# Patient Record
Sex: Female | Born: 2000 | Race: White | Hispanic: No | Marital: Single | Smoking: Never smoker
Health system: Southern US, Community
[De-identification: ages and names within clinical notes are randomized; demographics above are authoritative.]

## PROBLEM LIST (undated history)

## (undated) DIAGNOSIS — T7840XA Allergy, unspecified, initial encounter: Secondary | ICD-10-CM

## (undated) DIAGNOSIS — L309 Dermatitis, unspecified: Secondary | ICD-10-CM

## (undated) DIAGNOSIS — L858 Other specified epidermal thickening: Secondary | ICD-10-CM

## (undated) DIAGNOSIS — J45909 Unspecified asthma, uncomplicated: Secondary | ICD-10-CM

## (undated) DIAGNOSIS — J45990 Exercise induced bronchospasm: Secondary | ICD-10-CM

## (undated) HISTORY — DX: Exercise induced bronchospasm: J45.990

## (undated) HISTORY — DX: Dermatitis, unspecified: L30.9

## (undated) HISTORY — DX: Other specified epidermal thickening: L85.8

## (undated) HISTORY — DX: Allergy, unspecified, initial encounter: T78.40XA

---
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
## 2020-04-25 IMAGING — CT CT ENTEROGRAPHY (ABD-PELV W/ CM)
2 of 5 series · 16 of 46 positions shown, 18 images · IV contrast (iopamidol)
Comparison: None.

CLINICAL DATA: Left lower quadrant abdominal pain, diarrhea,
abnormal colonoscopy with erosions (suspected Crohn's)

EXAM:
CT ABDOMEN AND PELVIS WITH CONTRAST (ENTEROGRAPHY)
TECHNIQUE: Multidetector CT of the abdomen and pelvis during bolus
administration of intravenous contrast. Negative oral contrast was
given.
CONTRAST:  100mL WJWR50-UBB IOPAMIDOL (WJWR50-UBB) INJECTION 61%

[Series 4: enterography 2.00 br40 s3 cor cor · coronal · 0.58mm/px · 3 of 107 slices shown]
[im 36/107  soft-tissue]
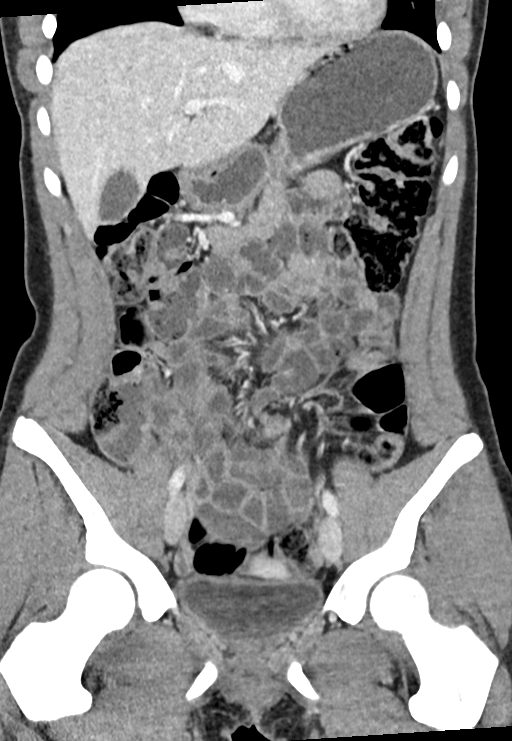
[im 48/107  soft-tissue]
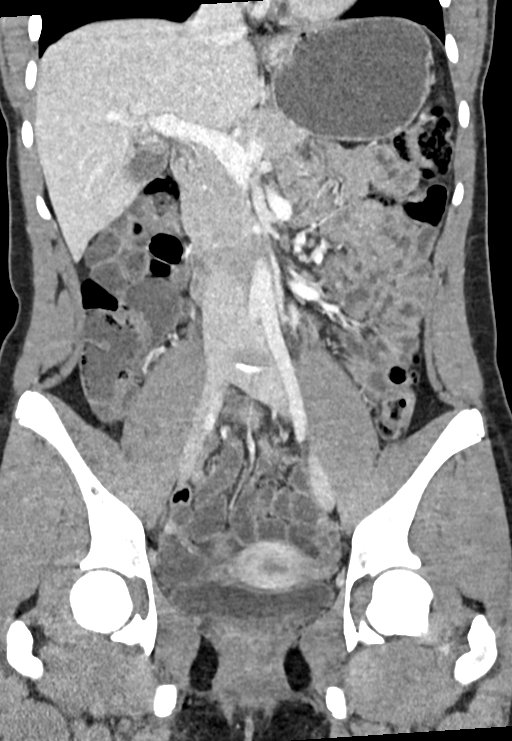
[im 59/107  soft-tissue]
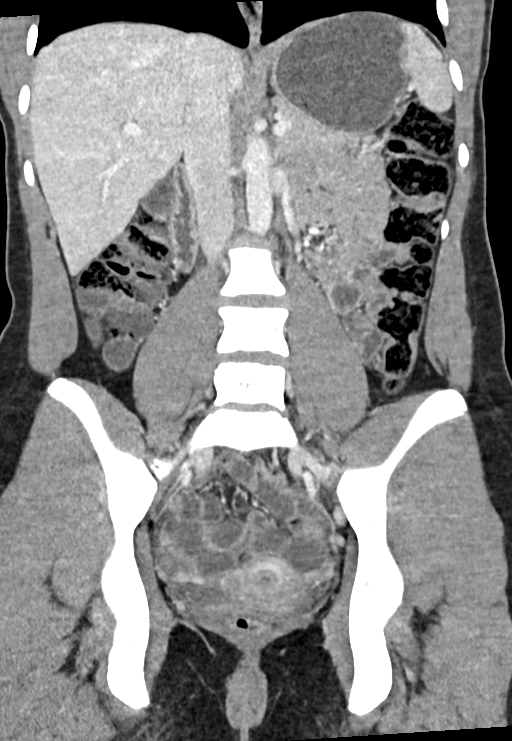

[Series 9: enterography 3.00 br40 s3 axial 3mm · axial · 0.61mm/px · z∈[+1335,+1710]mm · 13 of 143 slices shown, 15 images]
[im 9/143  soft-tissue]
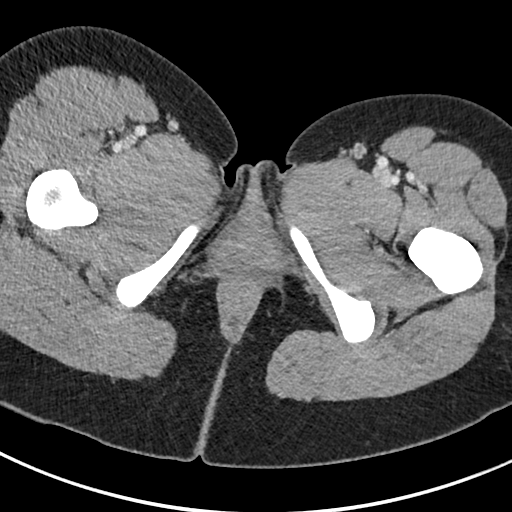
[im 9/143  bone]
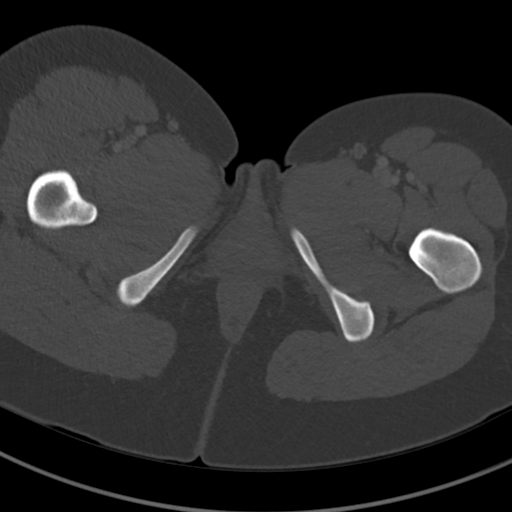
[im 17/143  soft-tissue]
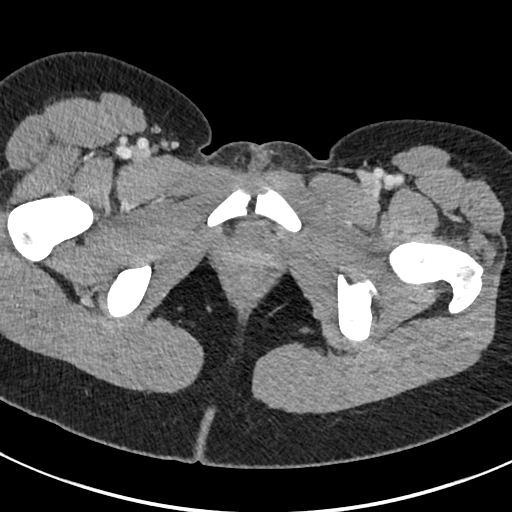
[im 34/143  soft-tissue]
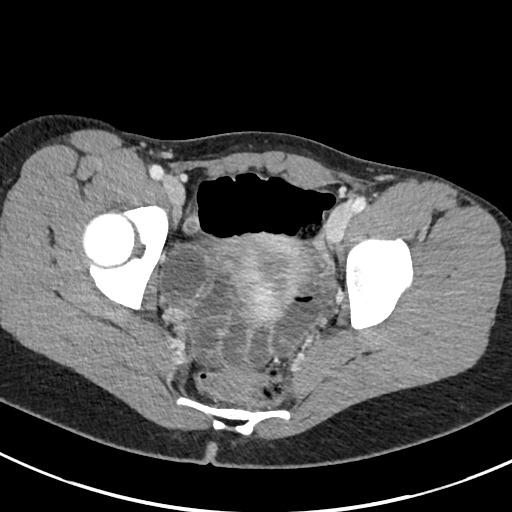
[im 42/143  soft-tissue]
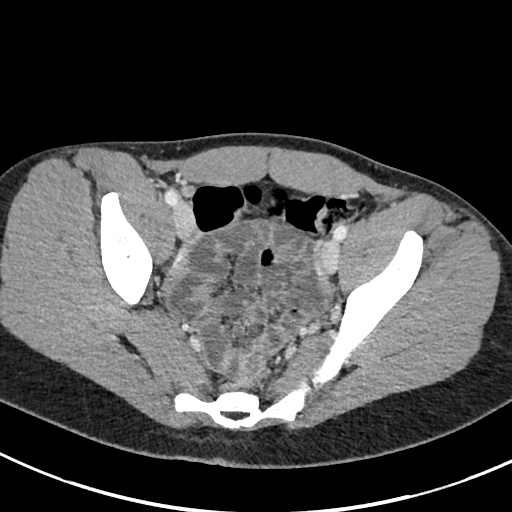
[im 51/143  soft-tissue]
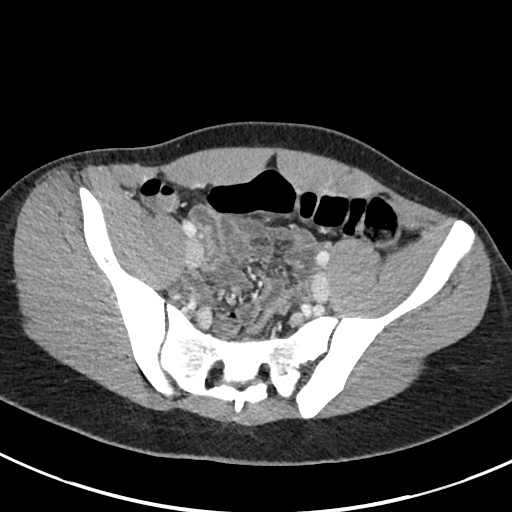
[im 59/143  soft-tissue]
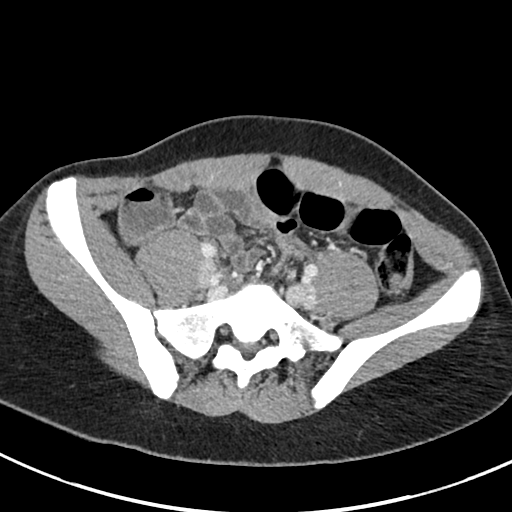
[im 76/143  soft-tissue]
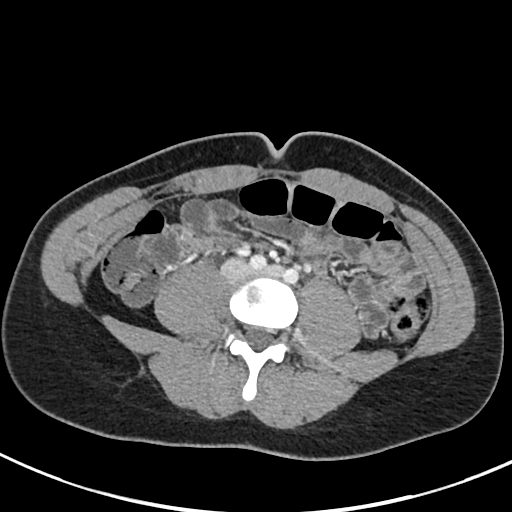
[im 84/143  soft-tissue]
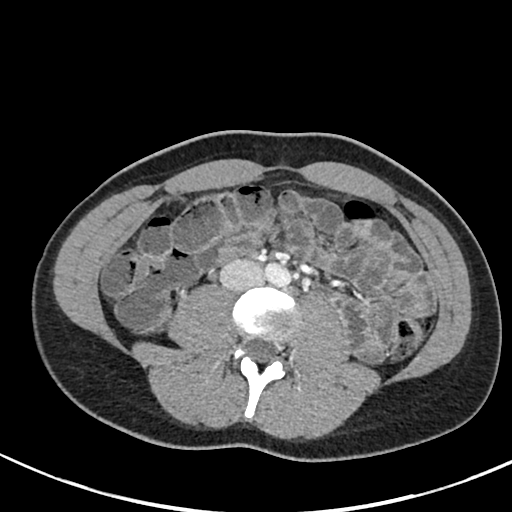
[im 92/143  soft-tissue]
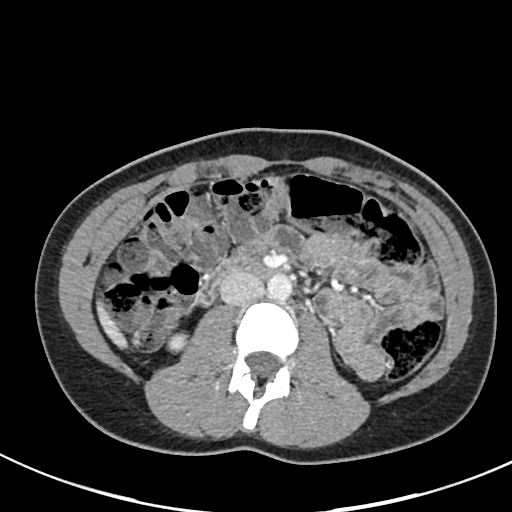
[im 92/143  bone]
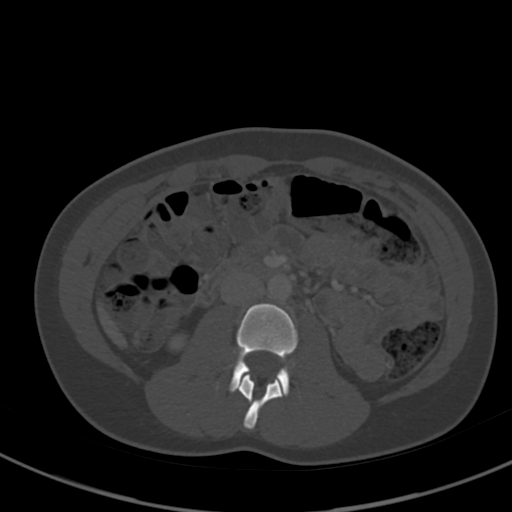
[im 101/143  soft-tissue]
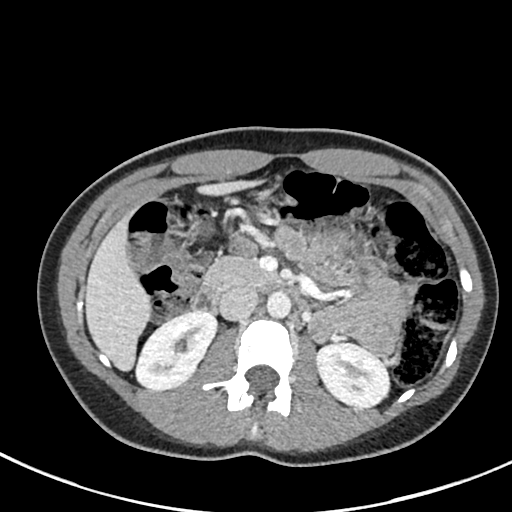
[im 109/143  soft-tissue]
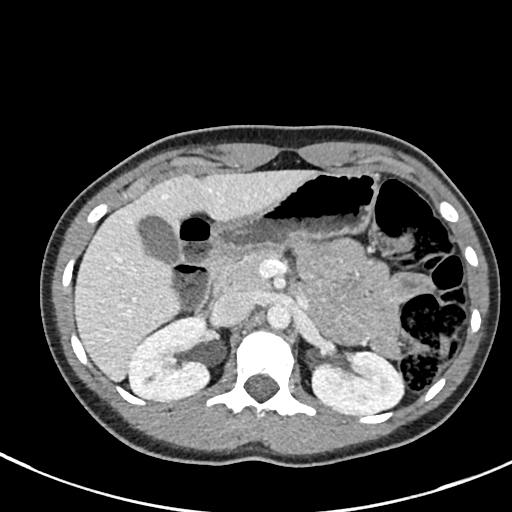
[im 126/143  soft-tissue]
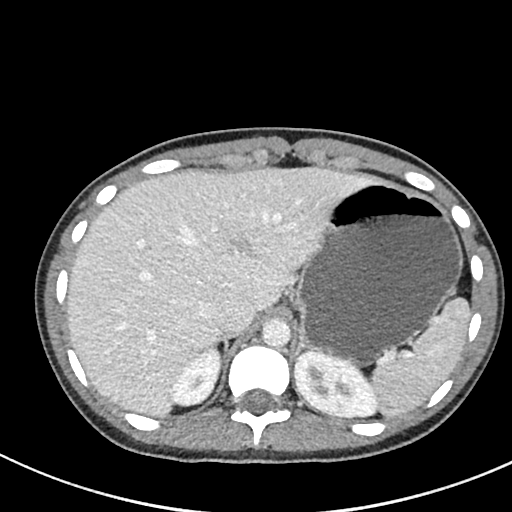
[im 134/143  soft-tissue]
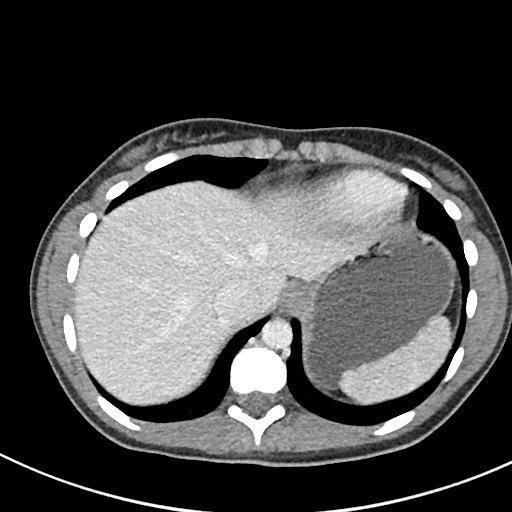

[16 of 46 positions shown; findings below may reference images not displayed]

FINDINGS: Lower chest:  Lung bases are clear.

Hepatobiliary: Liver is within normal limits.

Gallbladder is unremarkable. No intrahepatic or extrahepatic ductal
dilatation.

Pancreas: Within normal limits.

Spleen: Within normal limits.

Adrenals/Urinary Tract: Adrenal glands within normal limits.

Kidneys are within normal limits.  No hydronephrosis.

Bladder is within normal limits.

Stomach/Bowel: Stomach is within normal limits.

No evidence of bowel obstruction. No small bowel wall thickening or
inflammatory changes. Specifically, the terminal ileum is within
normal limits.

Normal appendix (series 9/image 98).

No colonic wall thickening or inflammatory changes.

Vascular/Lymphatic: No evidence of abdominal aortic aneurysm.

No suspicious abdominopelvic lymphadenopathy.

Reproductive: Uterus is within normal limits.

Left ovary is within normal limits.  No right adnexal mass.

Other: No abdominopelvic ascites.

Musculoskeletal: Visualized osseous structures are within normal
limits.
IMPRESSION: Negative CT enterography.

Specifically, no bowel wall thickening or inflammatory changes to
suggest active inflammatory Crohn's disease.

## 2024-02-01 ENCOUNTER — Other Ambulatory Visit: Payer: Self-pay

## 2024-02-01 ENCOUNTER — Emergency Department (HOSPITAL_BASED_OUTPATIENT_CLINIC_OR_DEPARTMENT_OTHER)
Admission: EM | Admit: 2024-02-01 | Discharge: 2024-02-01 | Disposition: A | Discharge status: Home / Self Care | Attending: Emergency Medicine | Admitting: Emergency Medicine

## 2024-02-01 ENCOUNTER — Emergency Department (HOSPITAL_BASED_OUTPATIENT_CLINIC_OR_DEPARTMENT_OTHER)

## 2024-02-01 ENCOUNTER — Encounter (HOSPITAL_BASED_OUTPATIENT_CLINIC_OR_DEPARTMENT_OTHER): Payer: Self-pay

## 2024-02-01 DIAGNOSIS — R1031 Right lower quadrant pain: Secondary | ICD-10-CM | POA: Diagnosis present

## 2024-02-01 LAB — CBC
HCT: 37.8 % (ref 36.0–46.0)
Hemoglobin: 12.5 g/dL (ref 12.0–15.0)
MCH: 29.5 pg (ref 26.0–34.0)
MCHC: 33.1 g/dL (ref 30.0–36.0)
MCV: 89.2 fL (ref 80.0–100.0)
Platelets: 250 K/uL (ref 150–400)
RBC: 4.24 MIL/uL (ref 3.87–5.11)
RDW: 12.5 % (ref 11.5–15.5)
WBC: 6.2 K/uL (ref 4.0–10.5)
nRBC: 0 % (ref 0.0–0.2)

## 2024-02-01 LAB — COMPREHENSIVE METABOLIC PANEL WITH GFR
ALT: 12 U/L (ref 0–44)
AST: 19 U/L (ref 15–41)
Albumin: 4.4 g/dL (ref 3.5–5.0)
Alkaline Phosphatase: 58 U/L (ref 38–126)
Anion gap: 10 (ref 5–15)
BUN: 13 mg/dL (ref 6–20)
CO2: 25 mmol/L (ref 22–32)
Calcium: 9.8 mg/dL (ref 8.9–10.3)
Chloride: 102 mmol/L (ref 98–111)
Creatinine, Ser: 0.73 mg/dL (ref 0.44–1.00)
GFR, Estimated: >60 mL/min
Glucose, Bld: 85 mg/dL (ref 70–99)
Potassium: 4.1 mmol/L (ref 3.5–5.1)
Sodium: 137 mmol/L (ref 135–145)
Total Bilirubin: 0.3 mg/dL (ref 0.0–1.2)
Total Protein: 7.2 g/dL (ref 6.5–8.1)

## 2024-02-01 LAB — URINALYSIS, ROUTINE W REFLEX MICROSCOPIC
Bilirubin Urine: NEGATIVE
Glucose, UA: NEGATIVE mg/dL
Hgb urine dipstick: NEGATIVE
Ketones, ur: NEGATIVE mg/dL
Nitrite: NEGATIVE
Protein, ur: NEGATIVE mg/dL
Specific Gravity, Urine: 1.016 (ref 1.005–1.030)
pH: 6.5 (ref 5.0–8.0)

## 2024-02-01 LAB — PREGNANCY, URINE: Preg Test, Ur: NEGATIVE

## 2024-02-01 LAB — LIPASE, BLOOD: Lipase: 26 U/L (ref 11–51)

## 2024-02-01 NOTE — ED Triage Notes (Signed)
 She c/o rlq area abd. Pain, waxing and waning x 1 week. She theorizes it may be another ovarian cyst--I've had those before. She is ambulatory and in no distress.

## 2024-02-01 NOTE — ED Notes (Signed)
 Patient transported to Ultrasound

## 2024-02-01 NOTE — ED Provider Notes (Signed)
 Patient seen in conjunction with [facility]. Pt with waxing/waning RLQ pain.  Workup here including labs are normal, ultrasound to evaluate for possibility of torsion or ovarian cyst was also negative.  Patient with no significant tenderness at time of exam, reports a dull ache.  No rebound or guarding or significant tenderness in the right lower quadrant.  Exam not consistent with appendicitis at this time.  BP (!) 123/90 (BP Location: Right Arm)   Pulse 71   Temp 98.1 F (36.7 C)   Resp 15   LMP 10/15/2023   SpO2 100%      Desiderio Chew, PA-C 02/01/24 1147    Ruthe Cornet, DO 02/01/24 1156

## 2024-02-01 NOTE — Discharge Instructions (Signed)
 Continue to rest and stay hydrated.  If pain continues follow-up with your primary care.  If the pain worsens or you start to develop any new symptoms return to the ER.

## 2024-02-01 NOTE — ED Provider Notes (Signed)
 " [place] EMERGENCY DEPARTMENT AT [facility] Provider Note   CSN: [id number] Arrival date & time: 02/01/24  9082     Patient presents with: Abdominal Pain   Marcia Schneider is a 23 y.o. female.    Abdominal Pain  23 year old female presenting with abdominal pain.  Patient reports that this has been going on for about a week.  Patient states that last Saturday she started to note a sharp cramping pain in her lower right abdomen.  This lasted for about 4 hours and subsided.  Since then she has had a dull ache in that area.  Yesterday after she began running she noticed that she had the same sharp cramping pain that she had had earlier in the week.  She has not taken anything for the pain.  The pain has not moved anywhere.  She did have a ovarian cyst that ruptured when she was 13 and states that the pain kind of feels like that.  Patient reports of constipation earlier in the week but that has since passed after taking MiraLAX.  She denies any urinary symptoms.  She denies any chest pain or shortness of breath.  Denies any fever or chills.  States that she has just felt off all week.  Patient denies any chance of being pregnant.  Denies any worries for STIs.  She states the pain is a 2 out of 10 pain.     Prior to Admission medications  Medication Sig Start Date End Date Taking? Authorizing Provider  albuterol  (PROVENTIL  HFA;VENTOLIN  HFA) 108 (90 Base) MCG/ACT inhaler Inhale 2 puffs into the lungs every 4 (four) hours as needed for wheezing. 08/02/15 01/31/17  Klett, Macario HERO, NP  beclomethasone (QVAR ) 40 MCG/ACT inhaler Inhale 1 puff into the lungs 2 (two) times daily. 08/02/15 02/01/16  Belenda Macario HERO, NP  ibuprofen  (ADVIL ,MOTRIN ) 200 MG tablet Take 2-3 tablets (400-600 mg total) by mouth every 6 (six) hours as needed for moderate pain or cramping. 09/19/13   Nivia Colon, PA-C    Allergies: Amoxicillin and Penicillins    Review of Systems  Gastrointestinal:  Positive for abdominal pain.   All other systems reviewed and are negative.   Updated Vital Signs BP (!) 123/90 (BP Location: Right Arm)   Pulse 71   Temp 98.1 F (36.7 C)   Resp 15   LMP 01/14/2024 (Approximate)   SpO2 100%   Physical Exam Vitals and nursing note reviewed.  HENT:     Mouth/Throat:     Pharynx: Oropharynx is clear.  Cardiovascular:     Rate and Rhythm: Normal rate.     Pulses: Normal pulses.  Pulmonary:     Effort: Pulmonary effort is normal.     Breath sounds: Normal breath sounds.  Abdominal:     General: Abdomen is flat. Bowel sounds are normal.     Palpations: Abdomen is soft.     Tenderness: There is abdominal tenderness in the right lower quadrant. There is no guarding or rebound. Negative signs include Rovsing's sign and McBurney's sign.  Skin:    General: Skin is warm and dry.  Neurological:     General: No focal deficit present.     Mental Status: She is alert.     (all labs ordered are listed, but only abnormal results are displayed) Labs Reviewed  URINALYSIS, ROUTINE W REFLEX MICROSCOPIC - Abnormal; Notable for the following components:      Result Value   Leukocytes,Ua SMALL (*)    Bacteria, UA  RARE (*)    All other components within normal limits  PREGNANCY, URINE  LIPASE, BLOOD  COMPREHENSIVE METABOLIC PANEL WITH GFR  CBC    EKG: None  Radiology: No results found.   Procedures   Medications Ordered in the ED - No data to display                                  Medical Decision Making Amount and/or Complexity of Data Reviewed Labs: ordered.   Impression: 23 year old female presenting with abdominal pain.  Differential diagnoses include ovarian cyst, appendicitis, ovarian torsion, constipation  Additional History: Patient was able to provide history.  I also reviewed outpatient visits.  Labs: CBC showed no elevated WBC and hemoglobin within normal limits.  Pregnancy test was negative.  No electrolyte, liver, or kidney function abnormalities  noted on CMP.  Imaging: Ultrasound of the pelvis showed no signs of ovarian torsion or cyst on her right ovary.  ED Course/Meds: Patient remained stable while in the ER.  Patient's labs and imaging showed no acute findings.  I discussed with patient about doing a CT.  She reports that she would rather wait on the CT and see if things get better.  I told her if she changed her mind and decided that she wanted the CT she could return anytime.  Educated on signs and symptoms of when to return and she verbalized that she understood.  Patient was educated to follow-up with her PCP if things continue.      Final diagnoses:  None    ED Discharge Orders     None          Rosaline Almarie KANDICE DEVONNA 02/01/24 1147    Ruthe Cornet, DO 02/01/24 1156  "
